# Patient Record
Sex: Female | Born: 1975 | Hispanic: Yes | Marital: Married | Smoking: Never smoker
Health system: Southern US, Community
[De-identification: ages and names within clinical notes are randomized; demographics above are authoritative.]

## PROBLEM LIST (undated history)

## (undated) DIAGNOSIS — E119 Type 2 diabetes mellitus without complications: Secondary | ICD-10-CM

## (undated) HISTORY — PX: CHOLECYSTECTOMY: SHX55

## (undated) HISTORY — DX: Type 2 diabetes mellitus without complications: E11.9

## (undated) HISTORY — PX: GALLBLADDER SURGERY: SHX652

---

## 2013-06-12 ENCOUNTER — Encounter (HOSPITAL_COMMUNITY): Payer: Self-pay | Admitting: Emergency Medicine

## 2013-06-12 ENCOUNTER — Emergency Department (HOSPITAL_COMMUNITY): Payer: Self-pay

## 2013-06-12 ENCOUNTER — Emergency Department (HOSPITAL_COMMUNITY)
Admission: EM | Admit: 2013-06-12 | Discharge: 2013-06-12 | Disposition: A | Discharge status: Home / Self Care | Payer: Self-pay | Attending: Emergency Medicine | Admitting: Emergency Medicine

## 2013-06-12 DIAGNOSIS — G43909 Migraine, unspecified, not intractable, without status migrainosus: Secondary | ICD-10-CM | POA: Insufficient documentation

## 2013-06-12 DIAGNOSIS — R42 Dizziness and giddiness: Secondary | ICD-10-CM | POA: Insufficient documentation

## 2013-06-12 MED ORDER — ONDANSETRON 4 MG PO TBDP
4.0000 mg | ORAL_TABLET | Freq: Once | ORAL | Status: AC
  Administered 2013-06-12: 4 mg via ORAL
  Filled 2013-06-12: qty 1

## 2013-06-12 MED ORDER — METOCLOPRAMIDE HCL 5 MG/ML IJ SOLN
10.0000 mg | Freq: Once | INTRAMUSCULAR | Status: AC
  Administered 2013-06-12: 10 mg via INTRAMUSCULAR
  Filled 2013-06-12: qty 2

## 2013-06-12 MED ORDER — HYDROCODONE-ACETAMINOPHEN 5-325 MG PO TABS
1.0000 | ORAL_TABLET | Freq: Once | ORAL | Status: AC
  Administered 2013-06-12: 1 via ORAL
  Filled 2013-06-12: qty 1

## 2013-06-12 MED ORDER — KETOROLAC TROMETHAMINE 30 MG/ML IJ SOLN
30.0000 mg | Freq: Once | INTRAMUSCULAR | Status: AC
  Administered 2013-06-12: 30 mg via INTRAMUSCULAR
  Filled 2013-06-12: qty 1

## 2013-06-12 MED ORDER — SUMATRIPTAN SUCCINATE 100 MG PO TABS: 50.0000 mg | ORAL_TABLET | ORAL | Status: AC | PRN

## 2013-06-12 NOTE — Discharge Instructions (Signed)
Cefalea migrañosa °(Migraine Headache) ° Una migraña es un dolor intenso y punzante en uno o ambos lados de la cabeza. Puede durar desde 30 minutos hasta varias horas.  °CAUSAS  °No siempre se conoce la causa exacta. Sin embargo, pueden aparecer cuando los nervios del cerebro se irritan y liberan ciertas sustancias químicas que causan inflamación. Esto ocasiona dolor.  °SÍNTOMAS  °· Dolor en uno o ambos lados de la cabeza. °· Dolor punzante o con pulsaciones. °· Dolor que es lo suficientemente grave en intensidad como para impedir las actividades habituales. °· Se agrava por cualquier actividad física habitual. °· Náuseas, vómitos o ambos. °· Mareos. °· Dolor ante la exposición a luces brillantes o a ruidos fuertes o con la actividad. °· Sensibilidad general a las luces brillantes, a los ruidos fuertes o a los olores. °Antes de sentir a una migraña, puede recibir señales de advertencia de que está por aparecer (aura). Un aura puede incluir:  °· Visión de luces intermitentes. °· Visión de puntos brillantes, halos o líneas en zigzag. °· Visión en túnel o visión borrosa. °· Sensación de entumecimiento u hormigueo. °· Tener dificultad para hablar. °· Tener debilidad muscular. °DISPARADORES DE LA CEFALEA MIGRAÑOSA °· Beber alcohol. °· El hábito de fumar. °· El estrés. °· Con la menstruación. °· Quesos estacionados. °· Alimentos o bebidas que contienen nitratos, glutamato, aspartamo o tiramina. °· La falta de sueño. °· Chocolate. °· Cafeína. °· Hambre. °· Con una actividad física extenuante. °· Fatiga. °· Los medicamentos utilizados para tratar el dolor en el pecho (nitroglicerina), las píldoras anticonceptivas, los estrógenos y algunos medicamentos para la hipertensión pueden provocarla. °DIAGNÓSTICO °Una cefalea migrañosa a menudo se diagnostica según: °· Síntomas. °· Examen físico. °· Una TC (tomografía computada) o resonancia magnética de la cabeza. °TRATAMIENTO °Le prescribirán medicamentos para aliviar el dolor y  las náuseas. También podrán administrarse medicamentos para ayudar a prevenir las migrañas recurrentes.  °INSTRUCCIONES PARA EL CUIDADO EN EL HOGAR  °· Sólo tome medicamentos de venta libre o recetados para calmar el dolor o el malestar, según las indicaciones de su médico. No se recomienda usar analgésicos narcóticos a largo plazo. °· Acuéstese en un cuarto oscuro y tranquilo cuando tiene migraña. °· Lleve un registro diario para averiguar lo que puede provocar dolores de cabeza por la migraña. Por ejemplo, escriba: °· Lo que come y bebe. °· Cuánto tiempo duerme. °· Todo cambio en la dieta o medicamentos. °· Limite el consumo de bebidas alcohólicas. °· Si fuma, deje de hacerlo. °· Duerma entre 7 y 9 horas o como lo indique su médico. °· Limite las situaciones de estrés. °· Mantenga las luces tenues si le molestan las luces brillantes y la migraña empeora. °SOLICITE ATENCIÓN MÉDICA DE INMEDIATO SI:  °· La migraña se hace cada vez más intensa. °· Tiene fiebre. °· Presenta rigidez en el cuello. °· Tiene pérdida de visión. °· Presenta debilidad muscular o pérdida del control muscular. °· Comienza a perder el equilibrio o tiene problemas para caminar. °· Sufre mareos o se desmaya. °· Tiene síntomas graves que son diferentes a los primeros síntomas. °ASEGÚRESE QUE:  °· Comprende estas instrucciones. °· Controlará su enfermedad. °· Solicitará ayuda de inmediato si no mejora o empeora. °Document Released: 07/16/2005 Document Revised: 10/08/2011 °ExitCare® Patient Information ©2014 [employer]. ° °

## 2013-06-12 NOTE — ED Provider Notes (Signed)
CSN: [id number]     Arrival date & time 06/12/13  0930 History  This chart was scribed for Roney Marion, MD by Bennett Scrape, ED Scribe. This patient was seen in room APA19/AP[facility]19 and the patient's care was started at 9:56 AM.   Chief Complaint  Patient presents with  . Headache    The history is provided by the patient. No language interpreter was used.   HPI Comments: Madison Cameron is a 37 y.o. female with no significant h/o migraines who presents to the Emergency Department complaining of intermittent right-sided HAs for the past few months that became worse 4 weeks ago that she attributes to financial stress. She reports that the location increased to include the area behind her eye and in the occipital area. She describes the pain as a crushing sensation that she rates her pain a 9 out of 10 currently. She lists associated dizziness described as a "werid feeling", last episode was this morning, and photophobia. She denies any spinning sensation. She denies any recent head injuries or prior concussions. She denies nausea and emesis, neck or back pain, CP or abdominal pain, extremity weakness or numbness. She denies any recent illnesses.   History reviewed. No pertinent past medical history. Past Surgical History  Procedure Laterality Date  . Cholecystectomy     No family history on file. History  Substance Use Topics  . Smoking status: Never Smoker   . Smokeless tobacco: Not on file  . Alcohol Use: No    Review of Systems  Constitutional: Negative for fever, chills, diaphoresis, appetite change and fatigue.  HENT: Negative for mouth sores, sore throat and trouble swallowing.   Eyes: Positive for photophobia. Negative for visual disturbance.  Respiratory: Negative for cough, chest tightness, shortness of breath and wheezing.   Cardiovascular: Negative for chest pain.  Gastrointestinal: Negative for nausea, vomiting, abdominal pain, diarrhea and abdominal distention.  Endocrine:  Negative for polydipsia, polyphagia and polyuria.  Genitourinary: Negative for dysuria, frequency and hematuria.  Musculoskeletal: Negative for gait problem.  Skin: Negative for color change, pallor and rash.  Neurological: Positive for dizziness and headaches. Negative for syncope and light-headedness.  Hematological: Does not bruise/bleed easily.  Psychiatric/Behavioral: Negative for behavioral problems and confusion.    Allergies  Review of patient's allergies indicates no known allergies.  Home Medications   Current Outpatient Rx  Name  Route  Sig  Dispense  Refill  . acetaminophen (TYLENOL) 500 MG tablet   Oral   Take 1,000 mg by mouth every 6 (six) hours as needed for headache.         . SUMAtriptan (IMITREX) 100 MG tablet   Oral   Take 0.5 tablets (50 mg total) by mouth every 2 (two) hours as needed for migraine or headache. May repeat in 2 hours if headache persists or recurs.   10 tablet   0     Triage Vitals: BP 131/77  Pulse 73  Temp(Src) 98 F (36.7 C) (Oral)  Resp 16  Ht 5' (1.524 m)  Wt 190 lb (86.183 kg)  BMI 37.11 kg/m2  SpO2 97%  LMP 05/22/2013  Physical Exam  Nursing note and vitals reviewed. Constitutional: She is oriented to person, place, and time. She appears well-developed and well-nourished. No distress.  HENT:  Head: Normocephalic and atraumatic.  Mouth/Throat: Oropharynx is clear and moist.  No sign of oropharyngeal infection  Eyes: Conjunctivae are normal. Pupils are equal, round, and reactive to light. No scleral icterus.  3 mm pupils  bilaterally, no nystagmus   Neck: Normal range of motion. Neck supple. No thyromegaly present.  Cardiovascular: Normal rate and regular rhythm.  Exam reveals no gallop and no friction rub.   No murmur heard. Pulmonary/Chest: Effort normal and breath sounds normal. No respiratory distress. She has no wheezes. She has no rales.  Abdominal: Soft. Bowel sounds are normal. She exhibits no distension. There is  no tenderness. There is no rebound.  Musculoskeletal: Normal range of motion.  Neurological: She is alert and oriented to person, place, and time. No cranial nerve deficit.  No pronator drift, normal finger to nose. Normal strength and sensation in all extremities.   Skin: Skin is warm and dry. No rash noted.  Psychiatric: She has a normal mood and affect. Her behavior is normal.    ED Course  Procedures (including critical care time)  Medications  ketorolac (TORADOL) 30 MG/ML injection 30 mg (not administered)  metoCLOPramide (REGLAN) injection 10 mg (not administered)  ondansetron (ZOFRAN-ODT) disintegrating tablet 4 mg (4 mg Oral Given 06/12/13 1017)  HYDROcodone-acetaminophen (NORCO/VICODIN) 5-325 MG per tablet 1 tablet (1 tablet Oral Given 06/12/13 1018)    DIAGNOSTIC STUDIES: Oxygen Saturation is 97% on room air, normal by my interpretation.    COORDINATION OF CARE: 10:04 AM-Discussed treatment plan which includes medications and CT of head with pt at bedside and pt agreed to plan.   Labs Review Labs Reviewed - No data to display Imaging Review Ct Head Wo Contrast  06/12/2013   CLINICAL DATA:  Right site headache  EXAM: CT HEAD WITHOUT CONTRAST  TECHNIQUE: Contiguous axial images were obtained from the base of the skull through the vertex without intravenous contrast.  COMPARISON:  None.  FINDINGS: No skull fracture is noted. Paranasal sinuses and mastoid air cells are unremarkable. No intracranial hemorrhage, mass effect or midline shift. No acute cortical infarction. No hydrocephalus. No mass lesion is noted on this unenhanced scan. The gray and white-matter differentiation is preserved.  IMPRESSION: No acute intracranial abnormality.   Electronically Signed   By: Natasha Mead M.D.   On: 06/12/2013 10:47    EKG Interpretation   None       MDM   1. Migraine headache    CT scan is normal. Headache minimally improved. She was given Toradol Reglan IM. She'll be discharged  home. Affect future headaches. Diagnosis is migraine headache.   I personally performed the services described in this documentation, which was scribed in my presence. The recorded information has been reviewed and is accurate.    Roney Marion, MD 06/12/13 [phone]

## 2013-06-12 NOTE — Progress Notes (Signed)
ED/CM noted patient did not have health insurance and/or PCP listed in the computer.  Patient was given the [place] resource handout with information on the clinics, food pantries, and the handout for new health insurance sign-up.  Patient expressed appreciation for information received. 

## 2013-06-12 NOTE — ED Notes (Signed)
Pt c/o constant headache x 3 weeks.  Reports nausea today, no vomiting.

## 2014-08-06 IMAGING — CT CT HEAD W/O CM
1 series · 16 of 30 positions shown, 20 images · non-contrast
Comparison: None.

CLINICAL DATA: Right site headache

EXAM:
CT HEAD WITHOUT CONTRAST
TECHNIQUE: Contiguous axial images were obtained from the base of the skull
through the vertex without intravenous contrast.

[Series 2: headseq 4.8 h37s · axial · 0.46mm/px · z∈[+48,+183]mm · 16 of 30 slices shown, 20 images]
[im 2/30  brain]
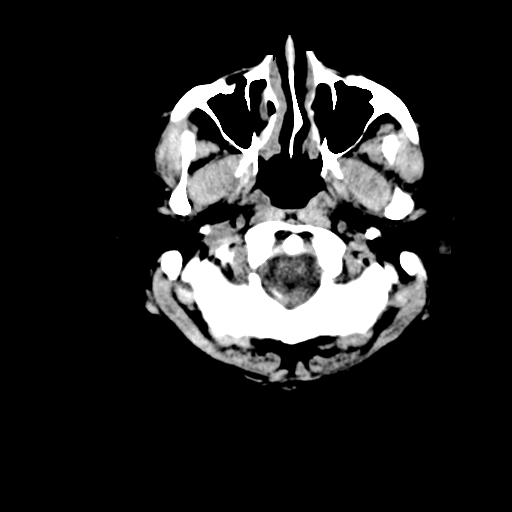
[im 2/30  bone]
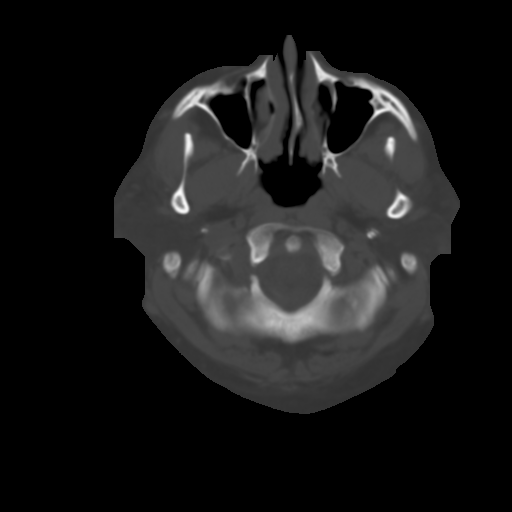
[im 4/30  brain]
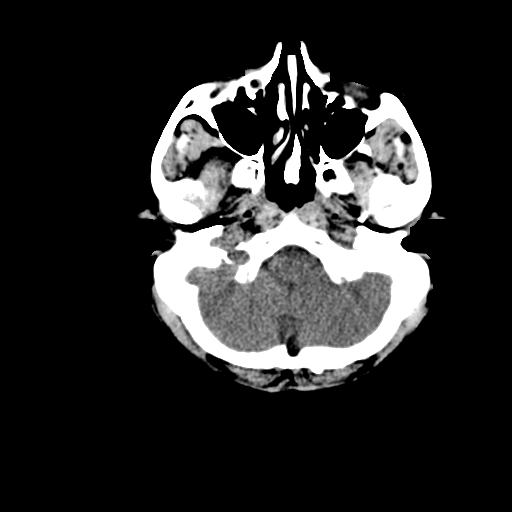
[im 6/30  brain]
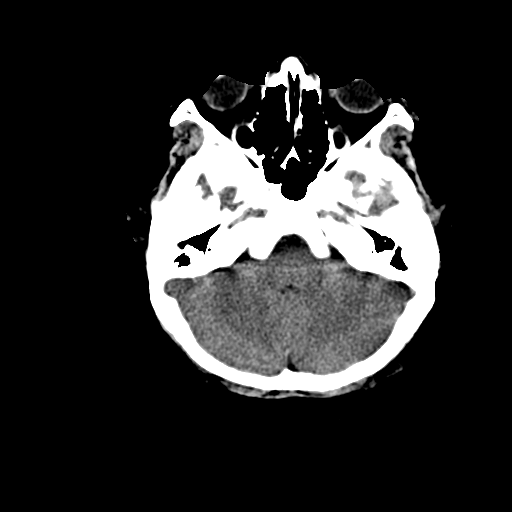
[im 8/30  brain]
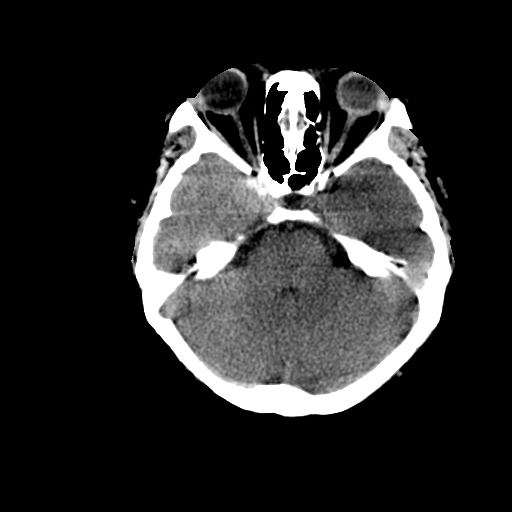
[im 9/30  brain]
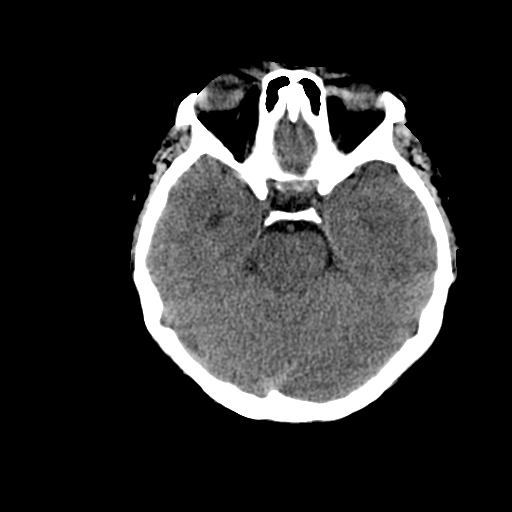
[im 9/30  bone]
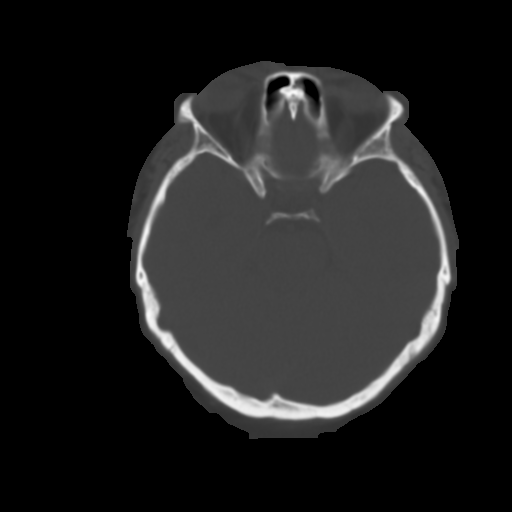
[im 11/30  brain]
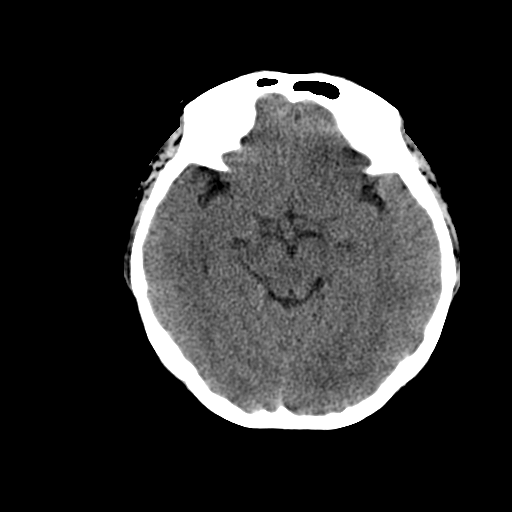
[im 13/30  brain]
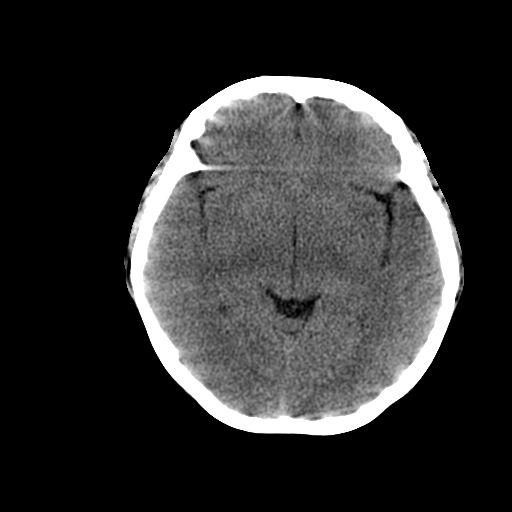
[im 15/30  brain]
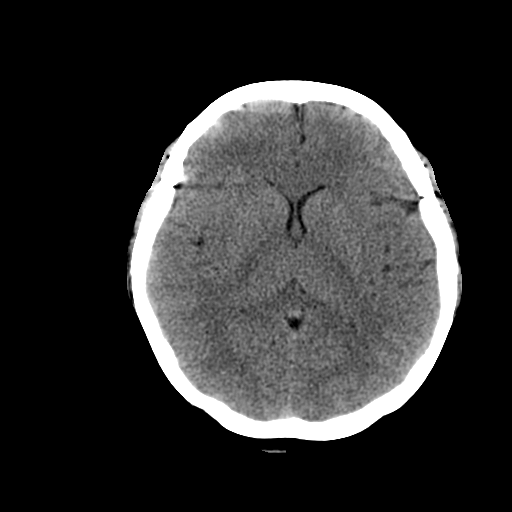
[im 16/30  brain]
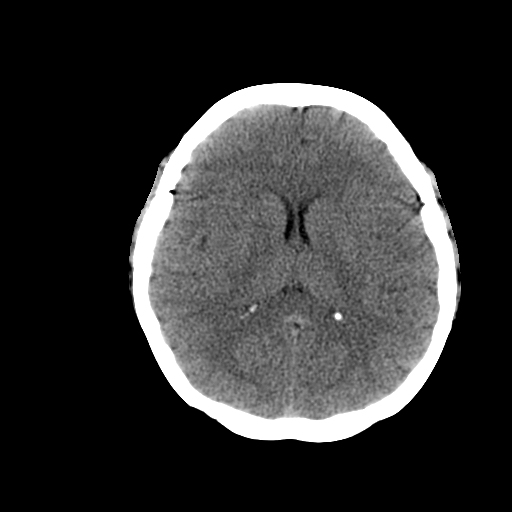
[im 16/30  bone]
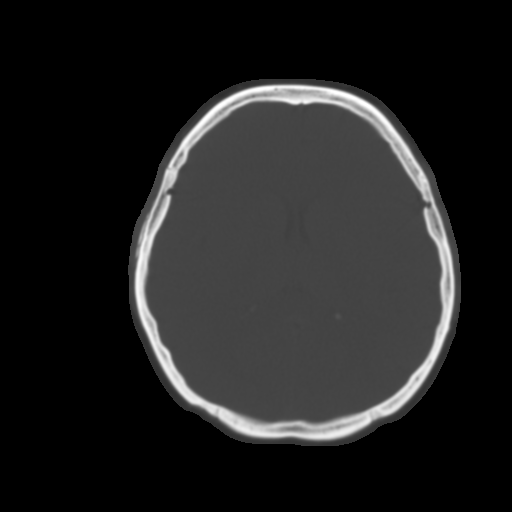
[im 18/30  brain]
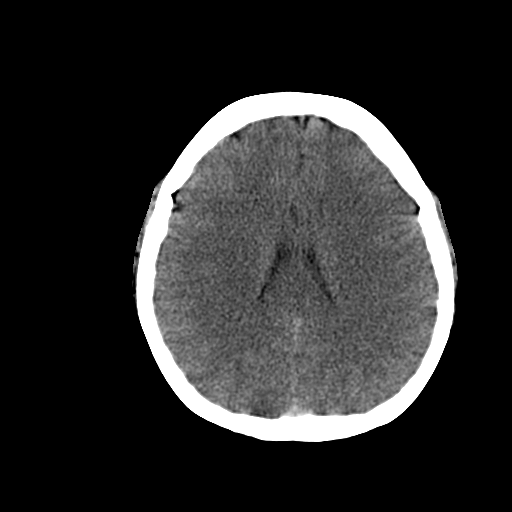
[im 20/30  brain]
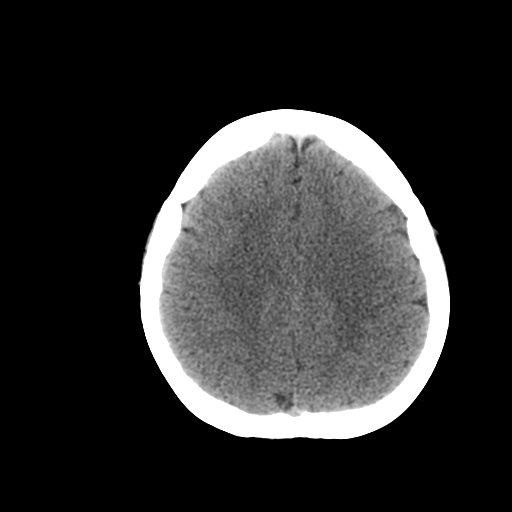
[im 22/30  brain]
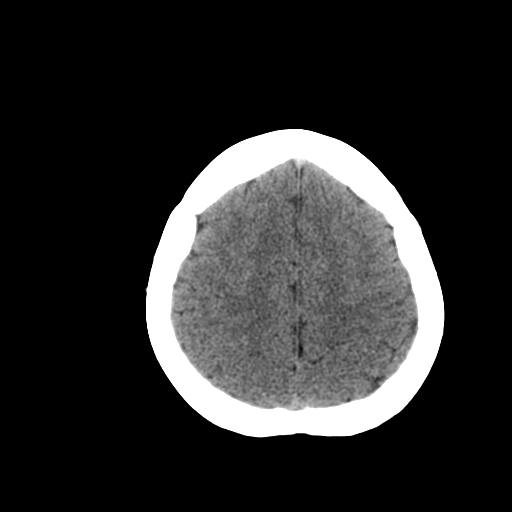
[im 23/30  brain]
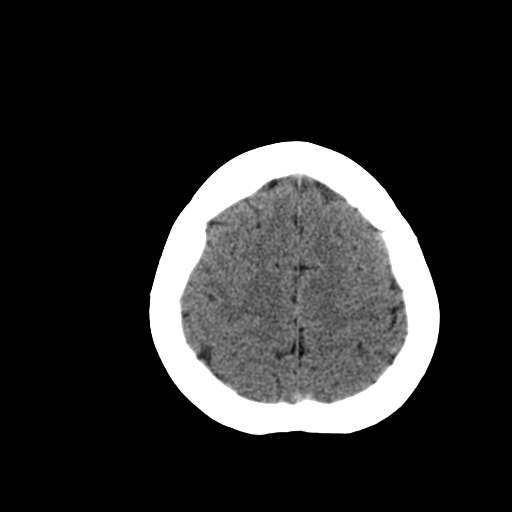
[im 23/30  bone]
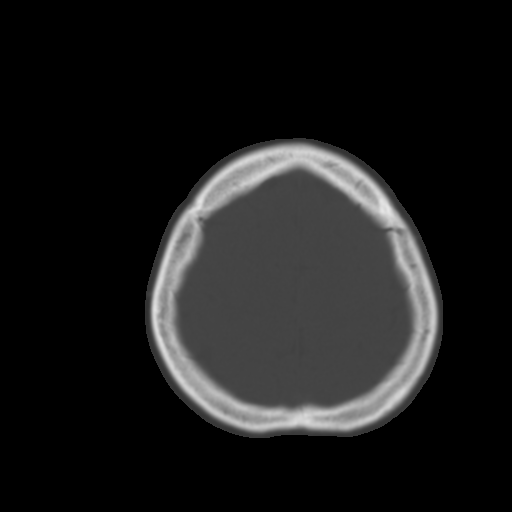
[im 25/30  brain]
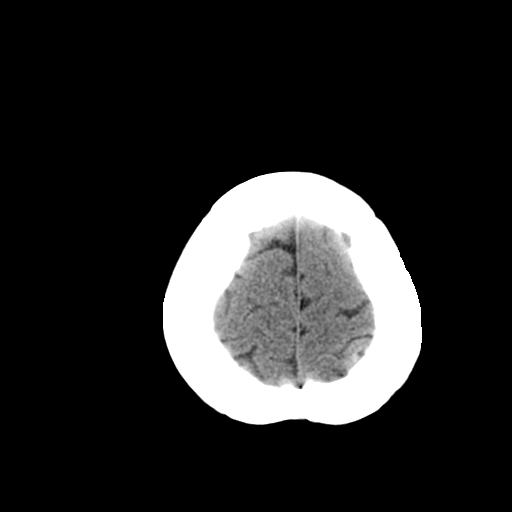
[im 27/30  brain]
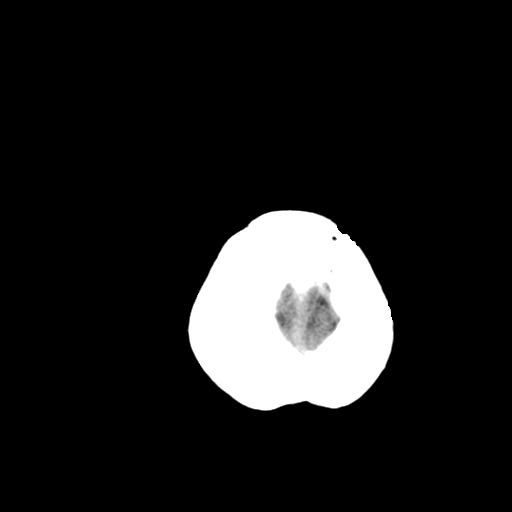
[im 29/30  brain]
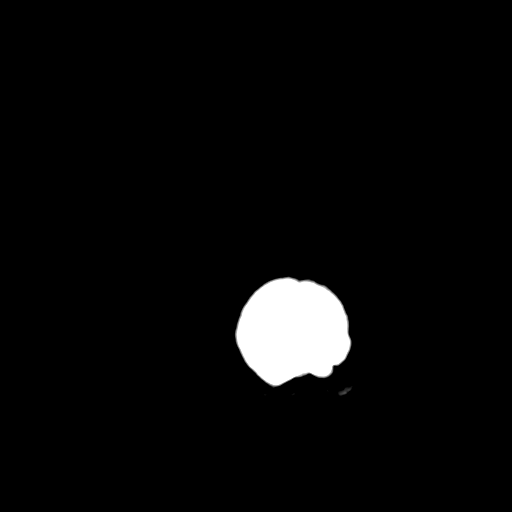

[16 of 30 positions shown; findings below may reference images not displayed]

FINDINGS: No skull fracture is noted. Paranasal sinuses and mastoid air cells
are unremarkable. No intracranial hemorrhage, mass effect or midline
shift. No acute cortical infarction. No hydrocephalus. No mass
lesion is noted on this unenhanced scan. The gray and white-matter
differentiation is preserved.
IMPRESSION: No acute intracranial abnormality.

## 2016-06-28 ENCOUNTER — Encounter: Payer: Self-pay | Admitting: Orthopaedic Surgery

## 2016-06-29 ENCOUNTER — Telehealth: Payer: Self-pay | Admitting: Orthopaedic Surgery

## 2016-06-29 NOTE — Telephone Encounter (Signed)
Patient called to request appointment following [facility] problem of elbow fracture.  Aware to [facility] and [facility].  Patient also relays self-pay; prototcol discussed.  Appointment pending.

## 2016-07-02 NOTE — Telephone Encounter (Signed)
Patient called back; states has films and reports from [facility]; appointment scheduled.

## 2016-07-03 ENCOUNTER — Encounter: Payer: Self-pay | Admitting: Orthopaedic Surgery

## 2016-07-03 ENCOUNTER — Ambulatory Visit (INDEPENDENT_AMBULATORY_CARE_PROVIDER_SITE_OTHER): Payer: Self-pay | Admitting: Orthopaedic Surgery

## 2016-07-03 VITALS — BP 115/74 | HR 71 | Temp 98.1°F | Ht <= 58 in | Wt 226.0 lb

## 2016-07-03 DIAGNOSIS — S52124A Nondisplaced fracture of head of right radius, initial encounter for closed fracture: Secondary | ICD-10-CM

## 2016-07-03 MED ORDER — HYDROCODONE-ACETAMINOPHEN 5-325 MG PO TABS: 1.0000 | ORAL_TABLET | Freq: Four times a day (QID) | ORAL | 0 refills | Status: AC | PRN

## 2016-07-03 NOTE — Patient Instructions (Signed)
Cuidados del yeso o la férula  (Cast or Splint Care)  El yeso y las férulas sostienen los miembros lesionados y evitan que los huesos se muevan hasta que se curen.  CUIDADOS EN EL HOGAR  · Mantenga el yeso o la férula al descubierto durante el tiempo de secado.  ? El yeso tarda entre 14 y 48 horas en secarse.  ? La fibra de vidrio se seca en menos de 1 hora.  · No apoye el yeso sobre nada que sea más duro que una almohada durante 24 horas.  · No soporte ningún peso sobre el miembro lesionado. No haga presión sobre el yeso. Espere a que el médico lo autorice.  · Mantenga el yeso o la férula secos.  ? Cúbralos con una bolsa plástica cuando se dé un baño o los días de clima húmedo.  ? Si tiene un yeso en el tórax y la cintura (el tronco) báñese con una esponja hasta que se lo quiten.  ? Si el yeso se moja, séquelo con una toalla o un secador de cabello. Utilice el aire frío del secador.  · Mantenga el yeso o la férula limpios. Limpie el yeso sucio con un paño húmedo.  · Noponga objetos extraños debajo del yeso o de la férula.  · No se rasque la piel por debajo del molde con ningún objeto. Si siente picazón, use un secador de cabello con aire frío sobre la zona que pica.  · No recorte ni perfore el yeso.  · No retire el relleno acolchado que se encuentra debajo del yeso.  · Ejercite como le ha indicado el médico las articulaciones que se encuentran cerca del yeso.  · Eleve (levante) el miembro lesionado sobre 1 ó 2 almohadas durante los primeros 1 a 3 días.    SOLICITE AYUDA SI:  · El yeso o la férula se quiebran.  · Siente que el yeso o la férula están muy apretados o muy flojos.  · Siente una picazón intensa por debajo del yeso.  · El yeso se moja o tiene una zona blanda.  · Siente un feo olor que proviene del interior del yeso.  · Algún objeto se queda atascado bajo el yeso.  · La piel que rodea el yeso enrojece o se vuelve sensible.  · Le aparece dolor, o siente más dolor luego de la colocación del yeso.    SOLICITE  AYUDA DE INMEDIATO SI:  · Observa un líquido que sale por el yeso.  · No puede mover los dedos.  · Los dedos están de color azul o blanco, están fríos, le duelen y están inflamados (hinchados).  · Siente hormigueo o pierde la sensibilidad (adormecimiento) alrededor de la zona de la lesión.  · Aumenta el dolor o la presión debajo del yeso.  · Tiene problemas para respirar o le falta el aire.  · Siente dolor en el pecho.    Esta información no tiene como fin reemplazar el consejo del médico. Asegúrese de hacerle al médico cualquier pregunta que tenga.  Document Released: 12/18/2010 Document Revised: 03/18/2013 Document Reviewed: 01/07/2016  Elsevier Interactive Patient Education © 2017 [employer]

## 2016-07-03 NOTE — Progress Notes (Signed)
Subjective:    Patient ID: Madison Cameron, female    DOB: [birth year], 40 y.o.   MRN: [id number]  HPI She fell on 06-28-16 and hurt her right elbow. [facility] and x-rays show irregularity of the right radial head nondisplaced fracture.  She was given sling and pain medicine.  She has no other injury.  I have reviewed the ER and x-ray reports.  She has pain with motion of the right elbow.     Review of Systems  HENT: Negative for congestion.   Respiratory: Negative for cough and shortness of breath.   Cardiovascular: Negative for chest pain and leg swelling.  Endocrine: Negative for cold intolerance.  Musculoskeletal: Positive for arthralgias and joint swelling.  Allergic/Immunologic: Negative for environmental allergies.   Past Medical History:  Diagnosis Date  . Diabetes mellitus without complication [facility](HCC)     Past Surgical History:  Procedure Laterality Date  . CHOLECYSTECTOMY    . GALLBLADDER SURGERY      Current Outpatient Prescriptions on File Prior to Visit  Medication Sig Dispense Refill  . acetaminophen (TYLENOL) 500 MG tablet Take 1,000 mg by mouth every 6 (six) hours as needed for headache.    . SUMAtriptan (IMITREX) 100 MG tablet Take 0.5 tablets (50 mg total) by mouth every 2 (two) hours as needed for migraine or headache. May repeat in 2 hours if headache persists or recurs. (Patient not taking: Reported on 07/03/2016) 10 tablet 0   No current facility-administered medications on file prior to visit.     Social History   Social History  . Marital status: Married    Spouse name: N/A  . Number of children: N/A  . Years of education: N/A   Occupational History  . Not on file.   Social History Main Topics  . Smoking status: Never Smoker  . Smokeless tobacco: Never Used  . Alcohol use No  . Drug use: No  . Sexual activity: Not on file   Other Topics Concern  . Not on file   Social History Narrative  . No narrative on file     Family History  Problem Relation Age of Onset  . Cancer Maternal Grandmother     skin    BP 115/74   Pulse 71   Temp 98.1 F (36.7 C)   Ht 4\' 10"  (1.473 m)   Wt 226 lb (102.5 kg)   BMI 47.23 kg/m      Objective:   Physical Exam  Constitutional: She is oriented to person, place, and time. She appears well-developed and well-nourished.  HENT:  Head: Normocephalic and atraumatic.  Eyes: Conjunctivae and EOM are normal. Pupils are equal, round, and reactive to light.  Neck: Normal range of motion. Neck supple.  Cardiovascular: Normal rate, regular rhythm and intact distal pulses.   Pulmonary/Chest: Effort normal.  Abdominal: Soft.  Musculoskeletal: She exhibits tenderness (Pain right elbow with any supination or pronation.  ROM limited to 30 to 100.  Slight effusion.  NV intact.  Left elbow negative.).       Right elbow: She exhibits decreased range of motion, swelling and effusion. Tenderness found. Radial head tenderness noted.       Arms: Neurological: She is alert and oriented to person, place, and time. She displays normal reflexes. No cranial nerve deficit. She exhibits normal muscle tone. Coordination normal.  Skin: Skin is warm and dry.  Psychiatric: She has a normal mood and affect. Her behavior is normal. Judgment and  thought content normal.          Assessment & Plan:   Encounter Diagnosis  Name Primary?  . Closed nondisplaced fracture of head of right radius, initial encounter Yes   A long arm cast is applied.  Rx for pain medicine given.  Precautions discussed.  Continue sling.  Use ice as needed.  Return in two weeks.  X-rays out of the cast of the right elbow.  Call if any problem.  Electronically Signed Darreld McleanWayne Sandor Arboleda, MD 12/5/20172:36 PM

## 2016-07-17 ENCOUNTER — Ambulatory Visit (INDEPENDENT_AMBULATORY_CARE_PROVIDER_SITE_OTHER): Payer: Self-pay | Admitting: Orthopaedic Surgery

## 2016-07-17 ENCOUNTER — Ambulatory Visit (INDEPENDENT_AMBULATORY_CARE_PROVIDER_SITE_OTHER): Payer: Self-pay

## 2016-07-17 DIAGNOSIS — S52124D Nondisplaced fracture of head of right radius, subsequent encounter for closed fracture with routine healing: Secondary | ICD-10-CM

## 2016-07-17 NOTE — Progress Notes (Signed)
CC:  My elbow feels weird out of the cast  She had the splint removed.  She has little discomfort.   NV intact. ROM is good first time out of the cast.  X-rays were done, reported separately.  Encounter Diagnosis  Name Primary?  . Closed nondisplaced fracture of head of right radius with routine healing Yes   Leave cast off.  Do gentle ROM. No heavy lifting.  Use sling.  Return in two weeks.  X-rays of the right elbow on return.  Call if any problem.  Electronically Signed Darreld McleanWayne Broughton Eppinger, MD 12/19/20179:58 AM

## 2016-07-31 ENCOUNTER — Ambulatory Visit (INDEPENDENT_AMBULATORY_CARE_PROVIDER_SITE_OTHER): Payer: Self-pay

## 2016-07-31 ENCOUNTER — Ambulatory Visit (INDEPENDENT_AMBULATORY_CARE_PROVIDER_SITE_OTHER): Payer: Self-pay | Admitting: Orthopaedic Surgery

## 2016-07-31 DIAGNOSIS — S52124D Nondisplaced fracture of head of right radius, subsequent encounter for closed fracture with routine healing: Secondary | ICD-10-CM

## 2016-07-31 NOTE — Progress Notes (Signed)
CC:  My elbow is a little sore  She is doing well from the radial head fracture on the left.  NV is intact.  She has full supination and pronation but lacks full extension by 15 degrees.   Encounter Diagnosis  Name Primary?  . Closed nondisplaced fracture of head of right radius with routine healing Yes   Return in three weeks.  X-rays on return.  Do the exercises.  Call if any problem  Electronically Signed Darreld McleanWayne Briellah Baik, MD 1/2/201810:37 AM

## 2016-08-21 ENCOUNTER — Ambulatory Visit (INDEPENDENT_AMBULATORY_CARE_PROVIDER_SITE_OTHER): Payer: Self-pay

## 2016-08-21 ENCOUNTER — Ambulatory Visit (INDEPENDENT_AMBULATORY_CARE_PROVIDER_SITE_OTHER): Payer: Self-pay | Admitting: Orthopaedic Surgery

## 2016-08-21 DIAGNOSIS — S52124D Nondisplaced fracture of head of right radius, subsequent encounter for closed fracture with routine healing: Secondary | ICD-10-CM

## 2016-08-21 NOTE — Progress Notes (Signed)
CC:  My arm does not hurt  She has no pain of the right elbow.  She lacks about 20 degrees of full extension and only has about 25 of supination.  I will have her begin PT.  X-rays were done and reported separately.  Encounter Diagnosis  Name Primary?  . Closed nondisplaced fracture of head of right radius with routine healing, subsequent encounter Yes   Begin PT.    Return in two weeks.  Call if any problem.  Electronically Signed Darreld McleanWayne Kiwan Gadsden, MD 1/23/201810:02 AM

## 2016-08-22 ENCOUNTER — Ambulatory Visit (HOSPITAL_COMMUNITY): Payer: Self-pay | Attending: Orthopaedic Surgery | Admitting: Occupational Therapy

## 2016-08-22 ENCOUNTER — Encounter (HOSPITAL_COMMUNITY): Payer: Self-pay | Admitting: Occupational Therapy

## 2016-08-22 DIAGNOSIS — R29898 Other symptoms and signs involving the musculoskeletal system: Secondary | ICD-10-CM | POA: Insufficient documentation

## 2016-08-22 DIAGNOSIS — M25531 Pain in right wrist: Secondary | ICD-10-CM | POA: Insufficient documentation

## 2016-08-22 DIAGNOSIS — M25521 Pain in right elbow: Secondary | ICD-10-CM | POA: Insufficient documentation

## 2016-08-22 NOTE — Therapy (Signed)
[facility] [address] [place], [place], [address] Phone: [phone]   Fax:  [phone]  Occupational Therapy Evaluation  Patient Details  Name: Madison Cameron MRN: [id number] Date of Birth: 02-Mar-1976 Referring Provider: Dr. Darreld Mclean  Encounter Date: 08/22/2016      OT End of Session - 08/22/16 1616    Visit Number 1   Number of Visits 6   Date for OT Re-Evaluation 09/21/16   Authorization Type Self-pay   OT Start Time 1302   OT Stop Time 1348   OT Time Calculation (min) 46 min   Activity Tolerance Patient tolerated treatment well   Behavior During Therapy [facility] for tasks assessed/performed      Past Medical History:  Diagnosis Date  . Diabetes mellitus without complication [facility])     Past Surgical History:  Procedure Laterality Date  . CHOLECYSTECTOMY    . GALLBLADDER SURGERY      There were no vitals filed for this visit.      Subjective Assessment - 08/22/16 1600    Subjective  S: It doesn't hurt if I don't move it.    Pertinent History Pt is a 41 y/o female s/p nondiscplaced right radial head fracture on 06/28/16 following a fall. Pt reports pain with movement and weakness throughout elbow, arm, and wrist. Dr. Darreld Mclean referred pt to occupational therapy for evaluation and treatment.    Patient Stated Goals To be able to use my right arm without pain   Currently in Pain? Yes  with movement   Pain Score 5    Pain Location Elbow   Pain Orientation Right   Pain Descriptors / Indicators Aching   Pain Type Acute pain   Pain Radiating Towards fingers   Pain Onset More than a month ago   Pain Frequency Intermittent   Aggravating Factors  movement   Pain Relieving Factors ice/heat   Effect of Pain on Daily Activities limited ability to complete ADLs with RUE as dominant   Multiple Pain Sites No           [facility] OT Assessment - 08/22/16 1302      Assessment   Diagnosis s/p non-displaced fracture of  right radial head   Referring Provider Dr. Darreld Mclean   Onset Date 06/28/16   Prior Therapy None     Precautions   Precautions None     Restrictions   Weight Bearing Restrictions No     Balance Screen   Has the patient fallen in the past 6 months Yes   How many times? 1   Has the patient had a decrease in activity level because of a fear of falling?  No   Is the patient reluctant to leave their home because of a fear of falling?  No     Home  Environment   Family/patient expects to be discharged to: Private residence     Prior Function   Level of Independence Independent   Vocation Part time employment   Vocation Requirements cleans houses occasionally   Leisure crafts, baking and decorating cakes     ADL   ADL comments Pt is having difficulty with dressing, washing dishes, washing hair and grooming tasks, extending elbow, holding objects, lifting objects     Written Expression   Dominant Hand Right     Cognition   Overall Cognitive Status Within Functional Limits for tasks assessed     ROM / Strength   AROM / PROM / Strength  AROM;PROM;Strength     Palpation   Palpation comment Max fascial restrictions along right wrist, volar and dorsal forearm, and elbow     AROM   Overall AROM Comments Assessed seated   AROM Assessment Site Elbow;Forearm;Wrist   Right/Left Elbow Right   Right Elbow Flexion 102   Right Elbow Extension 34   Right/Left Forearm Right   Right Forearm Pronation 75 Degrees   Right Forearm Supination 58 Degrees   Right/Left Wrist Right   Right Wrist Extension 48 Degrees   Right Wrist Flexion 45 Degrees   Right Wrist Radial Deviation 32 Degrees   Right Wrist Ulnar Deviation 18 Degrees     PROM   Overall PROM Comments Assessed seated   PROM Assessment Site Elbow;Forearm;Wrist   Right/Left Elbow Right   Right Elbow Flexion 110   Right Elbow Extension 17   Right/Left Forearm Right   Right Forearm Pronation 80 Degrees   Right Forearm  Supination 68 Degrees   Right/Left Wrist Right   Right Wrist Extension 58 Degrees   Right Wrist Flexion 55 Degrees   Right Wrist Radial Deviation 32 Degrees   Right Wrist Ulnar Deviation 20 Degrees     Strength   Overall Strength Comments Assessed seated   Strength Assessment Site Elbow;Forearm;Wrist;Hand   Right/Left Elbow Right   Right Elbow Flexion 3-/5   Right Elbow Extension 3-/5   Right/Left Forearm Right   Right Forearm Pronation 3/5   Right Forearm Supination 3-/5   Right/Left Wrist Right   Right Wrist Flexion 3/5   Right Wrist Extension 3/5   Right Wrist Radial Deviation 3/5   Right Wrist Ulnar Deviation 3/5   Right/Left hand Right;Left   Right Hand Gross Grasp Functional   Right Hand Grip (lbs) 15   Right Hand Lateral Pinch 6 lbs   Right Hand 3 Point Pinch 5 lbs   Left Hand Gross Grasp Functional   Left Hand Grip (lbs) 58   Left Hand Lateral Pinch 15 lbs   Left Hand 3 Point Pinch 12 lbs                  OT Treatments/Exercises (OP) - 08/22/16 1626      Exercises   Exercises Elbow;Wrist     Elbow Exercises   Elbow Flexion PROM;5 reps   Elbow Extension PROM;5 reps   Forearm Supination PROM;5 reps   Forearm Pronation PROM;5 reps   Wrist Flexion PROM;5 reps   Wrist Extension PROM;5 reps               OT Education - 08/22/16 1609    Education provided Yes   Education Details elbow, forearm, and wrist A/ROM exercises; educated on self-myofascial release and P/ROM; yellow theraputty   Person(s) Educated Patient   Methods Explanation;Demonstration;Handout   Comprehension Verbalized understanding;Returned demonstration          OT Short Term Goals - 08/22/16 1622      OT SHORT TERM GOAL #1   Title Pt will be educated on and independent in HEP to improve ability to use RUE during daily task completion.    Time 6   Period Weeks   Status New     OT SHORT TERM GOAL #2   Title Pt will decrease pain to 3/10 or less to improve ability to  use RUE as dominant during daily tasks.    Time 6   Period Weeks   Status New     OT SHORT TERM GOAL #3  Title Pt will increase RUE elbow, forearm, and wrist ROM to WNL to improve ability to perform grooming tasks using RUE as dominant.    Time 6   Period Weeks   Status New     OT SHORT TERM GOAL #4   Title Pt will increase RUE grip strength by 10# for improved ability to grasp lightweight items with RUE.    Time 6   Period Weeks   Status New     OT SHORT TERM GOAL #5   Title Pt will improve RUE pinch strength by 3# for improve ability to grasp eating utensils.    Time 6   Period Weeks   Status New                  Plan - 08/22/16 1616    Clinical Impression Statement A: Pt is a 41 y/o female s/p nondisplaced right radial head fracture on 06/28/16. Pt presents with increased pain, fascial restrictions, decreased strength and ROM and limited functional use of RUE. Pt provided with elbow/forearm/wrist A/ROM exercises for HEP. Pt is self-pay therefore is unable to afford multiple therapy sessions. Pt agreeable to HEP and one visit next week before next MD appt for progression of HEP.    Rehab Potential Good   OT Frequency 1x / week   OT Duration 6 weeks   OT Treatment/Interventions Self-care/ADL training;Therapeutic exercise;Patient/family education;Manual Therapy;Cryotherapy;Therapeutic activities;Electrical Stimulation;Moist Heat;Passive range of motion   Plan P: Pt will benefit from skilled OT services to decrease RUE pain and fascial restrictions, and increase ROM, strength, and functional use of RUE as dominant. Treatment plan: myofascial release, manual therapy, P/ROM, A/ROM, elbow/forearm/wrist stretches and strengthening, grip and pinch strengthening. Progressive HEP. Next session: follow up on HEP, measure for MD appt, continue with MFR, P/ROM, and A/ROM.    OT Home Exercise Plan 08/22/16: elbow/forearm/wrist A/ROM, yellow theraputty   Consulted and Agree with Plan of  Care Patient      Patient will benefit from skilled therapeutic intervention in order to improve the following deficits and impairments:  Decreased strength, Impaired flexibility, Decreased activity tolerance, Pain, Decreased range of motion, Increased edema, Increased fascial restricitons, Impaired UE functional use  Visit Diagnosis: Pain in right elbow  Pain in right wrist  Other symptoms and signs involving the musculoskeletal system    Problem List There are no active problems to display for this patient.  Ezra Sites, OTR/L  [phone] 08/22/2016, 4:27 PM  [place] [facility] [address] [place], [place], [address] Phone: [phone]   Fax:  [phone]  Name: Madison Cameron MRN: [id number] Date of Birth: [birth year]

## 2016-08-22 NOTE — Patient Instructions (Signed)
Elbow Exercises: complete 10-15X, 1-3X/day  1) Elbow Flexion/Extension Bend your elbow upwards as shown and then lower to a straighten position.     2) Wrist Flexion  Start with wrist at edge of table, palm facing up. With wrist hanging slightly off table, curl wrist upward, and back down.      3) Wrist Extension  Start with wrist at edge of table, palm facing down. With wrist slightly off the edge of the table, curl wrist up and back down.      4) Radial Deviations  Start with forearm flat against a table, wrist hanging slightly off the edge, and palm facing the wall. Bending at the wrist only, and keeping palm facing the wall, bend wrist so fist is pointing towards the floor, back up to start position, and up towards the ceiling. Return to start.        5) WRIST PRONATION  Turn your forearm towards palm face down.  Keep your elbow bent and by the side of your  Body.      6) WRIST SUPINATION  Turn your forearm towards palm face up.  Keep your elbow bent and by the side of your  Body.            Home Exercises Program Theraputty Exercises  Do the following exercises 1-2 times a day using your affected hand.  1. Roll putty into a ball.  2. Make into a pancake.  3. Roll putty into a roll.  4. Pinch along log with first finger and thumb.   5. Make into a ball.  6. Roll it back into a log.   7. Pinch using thumb and side of first finger.  8. Roll into a ball, then flatten into a pancake.  9. Using your fingers, make putty into a mountain.

## 2016-08-29 ENCOUNTER — Encounter (HOSPITAL_COMMUNITY): Payer: Self-pay | Admitting: Occupational Therapy

## 2016-08-29 ENCOUNTER — Ambulatory Visit (HOSPITAL_COMMUNITY): Payer: Self-pay | Admitting: Occupational Therapy

## 2016-08-29 DIAGNOSIS — R29898 Other symptoms and signs involving the musculoskeletal system: Secondary | ICD-10-CM

## 2016-08-29 DIAGNOSIS — M25521 Pain in right elbow: Secondary | ICD-10-CM

## 2016-08-29 DIAGNOSIS — M25531 Pain in right wrist: Secondary | ICD-10-CM

## 2016-08-29 NOTE — Patient Instructions (Signed)
Elbow stretches:  1) Elbow Extension: Standing beside door with right side at door handle, grasp door handle with right hand and lean away from door. Hold for 10 seconds, repeat 3-5X  2) Elbow flexion: Facing wall, bend elbows and place on door, lean towards wall until elbow flexion stretch is felt. Hold for 10 seconds, repeat 3-5X  3) Weighted elbow extension stretch: Stand holding 1-2 pound weight (soup can or water bottle). Allow right arm to hand by your side, holding weight straight down. Hold for 10 seconds, repeat 3-5X  4) Wrist supination stretch: Sitting at table. Hold end of weight (or water bottle) in hand and allow hand to turn palm up at edge of table. Hold for 10 seconds, 3-5X

## 2016-08-29 NOTE — Therapy (Addendum)
[place], [place], [address] Phone: [phone]   Fax:  [phone]  Occupational Therapy Treatment & Discharge  Patient Details  Name: Madison Cameron MRN: [id number] Date of Birth: 1976-06-10 Referring Provider: Dr. Sanjuana Kava  Encounter Date: 08/29/2016      OT End of Session - 08/29/16 1642    Visit Number 2   Number of Visits 6   Date for OT Re-Evaluation 09/21/16   Authorization Type Self-pay   OT Start Time 1300   OT Stop Time 1345   OT Time Calculation (min) 45 min   Activity Tolerance Patient tolerated treatment well   Behavior During Therapy [facility] for tasks assessed/performed      Past Medical History:  Diagnosis Date  . Diabetes mellitus without complication [facility])     Past Surgical History:  Procedure Laterality Date  . CHOLECYSTECTOMY    . GALLBLADDER SURGERY      There were no vitals filed for this visit.      Subjective Assessment - 08/29/16 1302    Subjective  S: I can do a lot more with it now.    Currently in Pain? No/denies            [facility] OT Assessment - 08/29/16 1300      Assessment   Diagnosis s/p non-displaced fracture of right radial head     Precautions   Precautions None                  OT Treatments/Exercises (OP) - 08/29/16 1304      Exercises   Exercises Elbow;Wrist     Elbow Exercises   Elbow Flexion PROM;AROM;10 reps   Elbow Extension PROM;AROM;10 reps   Forearm Supination PROM;AROM;10 reps   Forearm Pronation PROM;AROM;10 reps   Wrist Flexion PROM;AROM;10 reps   Wrist Extension PROM;AROM;10 reps   Other elbow exercises elbow extension stretch: standing at doorway with right hand holding handle, leaning to right 3x, 10". Weighted elbow extension stretch, 2#, 3x, 10"   Other elbow exercises Elbow flexion stretch-leaning against door/wall, 3x, 10"     Additional Elbow Exercises   Hand Gripper with Large Beads all beads with gripper at  18#   Hand Gripper with Medium Beads all beads with gripper at 18#    Hand Gripper with Small Beads all beads with gripper at 18#     Weighted Stretch Over Towel Roll   Supination - Weighted Stretch 2 pounds;30 seconds     Manual Therapy   Manual Therapy Myofascial release   Manual therapy comments manual therapy completed prior to therapeutic exercises    Myofascial Release Myofascial release to right wrist, forearm, and elbow regions to decrease pain and fascial restrictions and increase joint mobility                OT Education - 08/29/16 1642    Education provided Yes   Education Details forearm supination stretch, elbow extension stretches, elbow flexion stretch   Person(s) Educated Patient   Methods Explanation;Demonstration;Handout   Comprehension Verbalized understanding;Returned demonstration          OT Short Term Goals - 08/29/16 1644      OT SHORT TERM GOAL #1   Title Pt will be educated on and independent in HEP to improve ability to use RUE during daily task completion.    Time 6   Period Weeks   Status On-going     OT SHORT TERM GOAL #2  Title Pt will decrease pain to 3/10 or less to improve ability to use RUE as dominant during daily tasks.    Time 6   Period Weeks   Status On-going     OT SHORT TERM GOAL #3   Title Pt will increase RUE elbow, forearm, and wrist ROM to WNL to improve ability to perform grooming tasks using RUE as dominant.    Time 6   Period Weeks   Status On-going     OT SHORT TERM GOAL #4   Title Pt will increase RUE grip strength by 10# for improved ability to grasp lightweight items with RUE.    Time 6   Period Weeks   Status On-going     OT SHORT TERM GOAL #5   Title Pt will improve RUE pinch strength by 3# for improve ability to grasp eating utensils.    Time 6   Period Weeks   Status On-going                  Plan - 08/29/16 1642    Clinical Impression Statement A: Pt reports improvements in wrist  flexion/extension and grip strength this session. Grip strength has improved from 15# to 20#. Pt has been completing exercises and reports now only pain is at elbow. Pt completed elbow and wrist A/ROM, as well as elbow flexion/extension stretches this session. Updated HEP provided.    Plan P: Follow up on MD appt. Continue with elbow/wrist A/ROM, stretching, and strengthening; grip and pinch strengthening.    OT Home Exercise Plan 08/22/16: elbow/forearm/wrist A/ROM, yellow theraputty   Consulted and Agree with Plan of Care Patient      Patient will benefit from skilled therapeutic intervention in order to improve the following deficits and impairments:  Decreased strength, Impaired flexibility, Decreased activity tolerance, Pain, Decreased range of motion, Increased edema, Increased fascial restricitons, Impaired UE functional use  Visit Diagnosis: Pain in right elbow  Pain in right wrist  Other symptoms and signs involving the musculoskeletal system    Problem List There are no active problems to display for this patient.  Guadelupe Sabin, OTR/L  [phone] 08/29/2016, 4:46 PM  [place], [place], [address] Phone: [phone]   Fax:  [phone]  Name: Madison Cameron MRN: [id number] Date of Birth: [birth year]     OCCUPATIONAL THERAPY DISCHARGE SUMMARY  Visits from Start of Care: 2  Current functional level related to goals / functional outcomes: Pt did not return since last visit on 08/29/16, current functional level unknown.    Remaining deficits: Unknown.      Plan: Patient agrees to discharge.  Patient goals were not met. Patient is being discharged due to not returning since the last visit.  ?????

## 2016-09-04 ENCOUNTER — Ambulatory Visit (INDEPENDENT_AMBULATORY_CARE_PROVIDER_SITE_OTHER): Payer: Self-pay | Admitting: Orthopaedic Surgery

## 2016-09-04 DIAGNOSIS — S52124D Nondisplaced fracture of head of right radius, subsequent encounter for closed fracture with routine healing: Secondary | ICD-10-CM

## 2016-09-04 MED ORDER — NAPROXEN 500 MG PO TABS: 500.0000 mg | ORAL_TABLET | Freq: Two times a day (BID) | ORAL | 5 refills | Status: AC

## 2016-09-04 NOTE — Progress Notes (Signed)
CC:  My elbow is a little better  She is going to OT to get better motion of the right elbow.  She is slowly getting better.  I have reviewed the PT notes.  She is to continue the OT.  Return in two weeks.  Rx for Naprosyn called in.  Encounter Diagnosis  Name Primary?  . Closed nondisplaced fracture of head of right radius with routine healing, subsequent encounter Yes   Electronically Signed Darreld McleanWayne Kyrielle Urbanski, MD 2/6/20189:54 AM

## 2016-09-07 ENCOUNTER — Ambulatory Visit (HOSPITAL_COMMUNITY): Payer: Self-pay | Attending: Orthopaedic Surgery

## 2016-09-21 ENCOUNTER — Ambulatory Visit: Payer: Self-pay | Admitting: Orthopaedic Surgery

## 2016-09-27 ENCOUNTER — Ambulatory Visit (INDEPENDENT_AMBULATORY_CARE_PROVIDER_SITE_OTHER): Payer: Self-pay | Admitting: Orthopaedic Surgery

## 2016-09-27 ENCOUNTER — Encounter: Payer: Self-pay | Admitting: Orthopaedic Surgery

## 2016-09-27 VITALS — BP 111/69 | HR 66 | Temp 97.7°F | Resp 18 | Ht 59.5 in | Wt 225.0 lb

## 2016-09-27 DIAGNOSIS — S52124D Nondisplaced fracture of head of right radius, subsequent encounter for closed fracture with routine healing: Secondary | ICD-10-CM

## 2016-09-27 NOTE — Progress Notes (Signed)
CC:  My elbow is OK  She has full ROM of the right elbow and no pain.  NV intact.  Encounter Diagnosis  Name Primary?  . Closed nondisplaced fracture of head of right radius with routine healing, subsequent encounter Yes   Discharge.  Electronically Signed Darreld McleanWayne Kaley Jutras, MD 3/1/201810:39 AM

## 2018-02-26 NOTE — Progress Notes (Signed)
  This encounter was created in error - please disregard This encounter was created in error - please disregard. This encounter was created in error - please disregard. This encounter was created in error - please disregard. This encounter was created in error - please disregard. 

## 2018-04-29 ENCOUNTER — Encounter: Payer: Self-pay | Admitting: Orthopaedic Surgery

## 2018-05-08 NOTE — Procedures (Signed)
This is an erroneous encounter please disregard ,  Closing  For provider   [phone]
